# Patient Record
Sex: Female | Born: 1995 | Race: White | Hispanic: No | Marital: Single | Smoking: Never smoker
Health system: Southern US, Community
[De-identification: ages and names within clinical notes are randomized; demographics above are authoritative.]

## PROBLEM LIST (undated history)

## (undated) HISTORY — PX: ELBOW SURGERY: SHX618

---

## 2014-01-14 ENCOUNTER — Encounter: Payer: Self-pay | Admitting: Emergency Medicine

## 2014-01-14 ENCOUNTER — Emergency Department
Admission: EM | Admit: 2014-01-14 | Disposition: A | Discharge status: Home / Self Care | Payer: Self-pay | Source: Ambulatory Visit | Attending: Emergency Medicine | Admitting: Emergency Medicine

## 2014-01-14 ENCOUNTER — Telehealth: Payer: Self-pay | Admitting: Orthopedic Surgery

## 2014-01-14 MED ORDER — IBUPROFEN 200 MG PO TABS *I*
ORAL_TABLET | ORAL | Status: AC
Start: 2014-01-14 — End: 2014-01-14
  Administered 2014-01-14: 400 mg via ORAL
  Filled 2014-01-14: qty 2

## 2014-01-14 MED ORDER — IBUPROFEN 200 MG PO TABS *I*
400.0000 mg | ORAL_TABLET | Freq: Once | ORAL | Status: AC
Start: 2014-01-14 — End: 2014-01-14

## 2014-01-14 NOTE — ED Provider Notes (Signed)
History     Chief Complaint   Patient presents with    Fall     Pt states she slipped on balck ice this morning and fell backwards hitting her left elbow. + head strike. Denies LOC.     HPI Comments: Lydia Harrison is a 18 y.o. female who presents after a fall, this morning. She reports she slipped suddenly on ice this morning, landing forcibly on her left elbow, now c/o significant pain to left elbow, constant, associated with swelling.      History provided by:  Patient and parent      History reviewed. No pertinent past medical history.         History reviewed. No pertinent past surgical history.    History reviewed. No pertinent family history.      Social History      has no tobacco, alcohol, drug, and sexual activity history on file.    Living Situation    Questions Responses    Patient lives with     Homeless No    Caregiver for other family member     External Services     Employment     Domestic Violence Risk           Problem List     There is no problem list on file for this patient.      Review of Systems   Review of Systems   Constitutional: Negative for fever and fatigue.   HENT: Negative for sore throat.    Eyes: Negative for visual disturbance.   Respiratory: Negative for cough and shortness of breath.    Cardiovascular: Negative for chest pain.   Gastrointestinal: Negative for nausea, vomiting and abdominal pain.   Genitourinary: Negative for decreased urine volume.   Musculoskeletal: Positive for arthralgias. Negative for back pain, gait problem and neck pain.   Skin: Negative for rash.   Neurological: Negative for dizziness, syncope, weakness, light-headedness, numbness and headaches.   Psychiatric/Behavioral: Negative for confusion.       Physical Exam     ED Triage Vitals   BP Heart Rate Heart Rate(via Pulse Ox) Resp Temp Temp Source SpO2 O2 Device O2 Flow Rate   01/14/14 0800 01/14/14 0800 -- 01/14/14 0800 01/14/14 0800 01/14/14 0800 01/14/14 0800 01/14/14 0800 --   115/76 mmHg 75  16  36.4 C (97.5 F) TEMPORAL 100 % None (Room air)       Weight           01/14/14 0800           44.861 kg (98 lb 14.4 oz)               Physical Exam   Constitutional: She is oriented to person, place, and time. She appears well-developed and well-nourished. No distress.   Comfortable, calm.   HENT:   Head: Normocephalic and atraumatic.   Right Ear: External ear normal.   Left Ear: External ear normal.   Nose: Nose normal.   Eyes: Conjunctivae are normal. Right eye exhibits no discharge. Left eye exhibits no discharge. No scleral icterus.   Neck: Normal range of motion. Neck supple.   Cardiovascular: Normal rate, regular rhythm, normal heart sounds and intact distal pulses.    Pulmonary/Chest: Effort normal. No respiratory distress.   Abdominal: She exhibits no distension.   Musculoskeletal:   LUE exam:  Tenderness and swelling to olecranon. Full active ROM of wrist/fingers. 5/5 strength with wrist extension/flexion, finger abduction/flexion/grip. Able to  oppose thumb to little finger, give thumbs up and peace signs with strength. 2+ radial and ulnar pulses; cap refill brisk. Sensation intact over deltoid shoulder patch, 1st dorsal webspace, volar tip of index finger, ulnar aspect of little finger. Muscle compartments soft.     Neurological: She is alert and oriented to person, place, and time. She exhibits normal muscle tone.   Skin: Skin is warm and dry. No rash noted. She is not diaphoretic.   Psychiatric: She has a normal mood and affect. Her behavior is normal. Thought content normal.   Nursing note and vitals reviewed.      Medical Decision Making      Amount and/or Complexity of Data Reviewed  Tests in the radiology section of CPT: ordered and reviewed  Decide to obtain previous medical records or to obtain history from someone other than the patient: yes  Obtain history from someone other than the patient: yes  Review and summarize past medical records: yes  Discuss the patient with other providers:  yes  Independent visualization of images, tracings, or specimens: yes        Initial Evaluation:  ED First Provider Contact    Date/Time Event User Comments    01/14/14 0805 ED Provider First Contact Wymon Swaney Initial Face to Face Provider Contact          Patient seen by me as above    Assessment:  18 y.o., female comes to the ED with elbow pain after a fall. Pt is stable, well appearing, normal neurovascular exam in arm. Xrays reveal an intraarticular olecranon fracture. Orthopedic surgery team evaluated and splinted arm. Plan is to have patient return next week for outpatient surgery. Pt and mother understand and agree with plan.    Differential Diagnosis includes fall from standing, fall on ice, elbow fracture.              Plan:   Diagnostics: xray  Therapeutic: analgesia  Disposition: discharge home with outpatient ortho follow-up        Salem SenateNicholas Arpi Diebold, MD          Salem SenateAloisio, Hadyn Azer, MD  01/14/14 [phone]

## 2014-01-14 NOTE — ED Notes (Signed)
Patient here with mother, fell going to school, injury to left elbow. Dr. Lynelle SmokeAloisio saw patient on arrival to room 10.  Patient c/o pain in the left elbow.

## 2014-01-14 NOTE — Telephone Encounter (Signed)
Pt scheduled for Tuesday 3/17.  Mom aware of arrival time and restrictions.

## 2014-01-14 NOTE — Discharge Instructions (Signed)
You were seen today for an elbow fracture.     At home, get lots of rest, drink plenty of fluids, and eat a healthy diet. You may take ibuprofen or tylenol over-the-counter as needed for pain, use as directed.    If any of your symptoms worsen or you develop additional symptoms that are concerning to you, follow up with your primary care doctor or return to the Emergency Department.

## 2014-01-14 NOTE — ED Notes (Signed)
Patient has ice to left elbow.

## 2014-01-14 NOTE — Telephone Encounter (Signed)
Dr. Andee PolesHumphrey,     Here is the olecranon fracture we discussed earlier:     [name] ([id number]) - 18 yo F with left olecranon fracture.  Patient is a world class [place] [employer] (ranked #27 in the [place]).  Plan for ORIF L olecranon on Tuesday 01/19/14.     Raynelle FanningJulie, would you mind calling patient today for scheduling purposes?  [phone]     Thanks,     Maury DusMike     Michael Maceroli, M.D., Resident

## 2014-01-14 NOTE — Telephone Encounter (Signed)
Left message on VM for patient/parent's to call the office to have surgery set up for 3/17

## 2014-01-14 NOTE — Provider Consult (Signed)
Orthopaedic Surgery Consult Note  Lydia Harrison, MRN: [id number]    CC: L elbow pain    HPI: 18 y.o. 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  States that she landed directly on the left elbow.  Denies any other musculoskeletal complaints.  States that she did hit her head but currently denies any headache, vision, hearing or balance issues.  Reports pain in the left elbow, sharp in nature, worse with full extension, denies numbness or tingling distally.    Past Medical History:   History reviewed. No pertinent past medical history.    Past Surgical History:   History reviewed. No pertinent past surgical history.    Allergies:  No Known Allergies (drug, envir, food or latex)     Home Medications:     (Not in a hospital admission)    No current facility-administered medications for this encounter.     No current outpatient prescriptions on file.       Social History:  Occupation: Audiological scientist  Smoking: no  Etoh: no  Illicit Drugs: no         Family History:  History reviewed. No pertinent family history.    Review of systems: as per HPI, otherwise negative for 12 system review      Physical Examination:  Vitals  BP 116/76    Pulse 63    Temp(Src) 36.8 C (98.2 F) (Temporal)    Resp 18    Ht 1.575 m (_0 )    Wt 44.861 kg (98 lb 14.4 oz)    BMI 18.08 kg/m2      SpO2 100%    General  Normal appearance, well nourished  comfortable  Pleasant mood and affect  Resp: Breathing easy  CV: RRR  Abd: soft, nondistended    Left upper extremity  72m abrasion over L elbow, dermis intact  + tenderness to left olecranon, minimal ecchymosis, and moderate swelling  Intact thumbs up/O.K. sign/cross fingers  Intact sensation to light touch on the 1st dorsal webspace/tip of index and little fingers  Able to range elbow from 90 to 45 degrees of flexion  Nontender to palpation hand, wrist, forearm, shoulder  2+ radial  pulse    Right upper extremity and bilateral lower extremity:  Skin intact  Non-tender/full range of motion and strength throughout    No results found for this basename: WBC, HCT, CREAT, INR, PLT, K,  in the last 168 hours    No results found for this basename: WBC, ESR, CRP,  in the last 168 hours    Imaging: personally reviewed; 4 views L elbow demonstrate an intraarticular olecranon fracture with >251marticular stepoff    Assessment and Plan:  [age].o. female, with a left olecranon fracture    1. Long arm splint applied  2. Neurovascular exam unchanged after splint  3. Will require operative fixation of left olecranon, plan for OR with Dr. HuRaynelle Dickext Tuesday, office will call patient for scheduling  4. WB status: NWB LUE  5. Elevate/ice injured extremity  6. Keep splint clean and dry, do not remove splint  7. Pain control per ED  8. Call [phone] any questions or concerns    MiRosie FateMD, Orthopaedics Resident  01/14/2014  9:27 AM

## 2014-01-14 NOTE — ED Notes (Signed)
VSS.  Cleared for d/c. Verbalized understanding of instructions. Has sling and Scarano pillow for left arm.  Patient's CMS Within normal limits. Splint placed by ortho. Patient and mother know follow up is with Dr. Andee PolesHumphrey. Ambulated out.

## 2014-01-15 NOTE — Plan of Care (Signed)
Problem: Knowledge deficit related to pre or post-op regimens  Goal: Patient verbalizes understanding of PACU teaching  Outcome: Completed or Resolved Date Met:  01/15/14

## 2014-01-19 ENCOUNTER — Ambulatory Visit
Admit: 2014-01-19 | Disposition: A | Discharge status: Home / Self Care | Payer: Self-pay | Source: Ambulatory Visit | Attending: Orthopedic Surgery | Admitting: Orthopedic Surgery

## 2014-01-19 ENCOUNTER — Other Ambulatory Visit: Payer: Self-pay | Admitting: Orthopedic Surgery

## 2014-01-19 DIAGNOSIS — S52023A Displaced fracture of olecranon process without intraarticular extension of unspecified ulna, initial encounter for closed fracture: Secondary | ICD-10-CM

## 2014-01-19 LAB — POCT URINE PREGNANCY: Lot #: 104663

## 2014-01-19 MED ORDER — HALOPERIDOL LACTATE 5 MG/ML IJ SOLN *I*
0.5000 mg | Freq: Once | INTRAMUSCULAR | Status: DC | PRN
Start: 2014-01-19 — End: 2014-01-19

## 2014-01-19 MED ORDER — PROPOFOL 10 MG/ML IV EMUL (INTERMITTENT DOSING) WRAPPED *I*
INTRAVENOUS | Status: AC
Start: 2014-01-19 — End: 2014-01-19
  Filled 2014-01-19: qty 20

## 2014-01-19 MED ORDER — CEFAZOLIN SODIUM-2000 MG DEXTROSE 3% DUPLEX IV *I*
2000.0000 mg | INTRAVENOUS | Status: AC
Start: 2014-01-19 — End: 2014-01-19
  Administered 2014-01-19: 2000 mg via INTRAVENOUS
  Filled 2014-01-19: qty 1

## 2014-01-19 MED ORDER — ONDANSETRON HCL 2 MG/ML IV SOLN *I*
INTRAMUSCULAR | Status: AC
Start: 2014-01-19 — End: 2014-01-19
  Filled 2014-01-19: qty 2

## 2014-01-19 MED ORDER — PROMETHAZINE HCL 25 MG/ML IJ SOLN *I*
INTRAMUSCULAR | Status: AC
Start: 2014-01-19 — End: 2014-01-19
  Filled 2014-01-19: qty 1

## 2014-01-19 MED ORDER — LIDOCAINE HCL 2 % (PF) IJ SOLN *I*
INTRAMUSCULAR | Status: AC
Start: 2014-01-19 — End: 2014-01-19
  Filled 2014-01-19: qty 5

## 2014-01-19 MED ORDER — DEXAMETHASONE SODIUM PHOSPHATE 4 MG/ML IJ SOLN
INTRAMUSCULAR | Status: AC
Start: 2014-01-19 — End: 2014-01-19
  Filled 2014-01-19: qty 1

## 2014-01-19 MED ORDER — LACTATED RINGERS IV SOLN *I*
125.0000 mL/h | INTRAVENOUS | Status: DC
Start: 2014-01-19 — End: 2014-01-19
  Administered 2014-01-19: 125 mL/h via INTRAVENOUS

## 2014-01-19 MED ORDER — MIDAZOLAM HCL 1 MG/ML IJ SOLN *I* WRAPPED
INTRAMUSCULAR | Status: AC
Start: 2014-01-19 — End: 2014-01-19
  Filled 2014-01-19: qty 2

## 2014-01-19 MED ORDER — MORPHINE SULFATE 2 MG/ML IV SOLN *WRAPPED*
2.0000 mg | Status: DC | PRN
Start: 2014-01-19 — End: 2014-01-20

## 2014-01-19 MED ORDER — OXYCODONE HCL 5 MG PO TABS *I*
5.0000 mg | ORAL_TABLET | ORAL | Status: DC | PRN
Start: 2014-01-19 — End: 2014-01-28

## 2014-01-19 MED ORDER — LACTATED RINGERS IV SOLN *I*
20.0000 mL/h | INTRAVENOUS | Status: DC
Start: 2014-01-19 — End: 2014-01-19
  Administered 2014-01-19: 20 mL/h via INTRAVENOUS

## 2014-01-19 MED ORDER — FENTANYL CITRATE 50 MCG/ML IJ SOLN *WRAPPED*
INTRAMUSCULAR | Status: AC
Start: 2014-01-19 — End: 2014-01-19
  Filled 2014-01-19: qty 2

## 2014-01-19 MED ORDER — OXYCODONE HCL 5 MG PO TABS *I*
5.0000 mg | ORAL_TABLET | ORAL | Status: DC | PRN
Start: 2014-01-19 — End: 2014-01-20

## 2014-01-19 MED ORDER — LACTATED RINGERS IV SOLN *I*
75.0000 mL/h | INTRAVENOUS | Status: DC
Start: 2014-01-19 — End: 2014-01-20

## 2014-01-19 MED ORDER — SODIUM CHLORIDE 0.9 % IV SOLN WRAPPED *I*
20.0000 mL/h | Status: DC
Start: 2014-01-19 — End: 2014-01-19

## 2014-01-19 MED ORDER — PROMETHAZINE HCL 25 MG/ML IJ SOLN *I*
12.5000 mg | INTRAMUSCULAR | Status: DC | PRN
Start: 2014-01-19 — End: 2014-01-20

## 2014-01-19 MED ORDER — LIDOCAINE HCL 1 % IJ SOLN
0.1000 mL | INTRAMUSCULAR | Status: DC | PRN
Start: 2014-01-19 — End: 2014-01-19
  Administered 2014-01-19: 0.1 mL via SUBCUTANEOUS
  Filled 2014-01-19: qty 2

## 2014-01-19 NOTE — Discharge Instructions (Signed)
DISCHARGE INSTRUCTIONS  Elbow Surgery  Dr. Sharlene Mottsatherine Humphrey    Activity: Following surgery your elbow needs protection. Keep the splint or sling on for support.  To avoid swelling and stiffness, move your fingers at least 5 times every day.    Hygiene:  It is important to remove the sling daily to gently wash your arm, chest and armpit.  To reach the area, lean forward and let your arm dangle while you clean your armpit.  Be sure to dry the area thoroughly so your skin does not become irritated.  You should continue to use your deodorant.  If needed, keep a soft, dry washcloth here to keep the area clean and dry.    Wound/Dressing Care:  Put ice over your bandage for 30 minutes up to 5 times a day for 5 days after surgery. Keep the wound covered with a dry bandage until after your first visit in the office after surgery NEXT WEEK. Plan will be to remove your splint and begin HAND THERAPY IMMEDIATELY AFTER YOUR CLINIC VISIT.    Medications:  Resume your routine medications.  A prescription for pain medication will be provided.  This is a narcotic and these medications have side effects.  As the pain lessens, you may switch to a non-prescription strength medication (such as Tylenol or ibuprofen).  Many pain medications cause constipation.  You can help prevent this by drinking plenty of liquids, eating a well-balanced diet and using a stool softener if needed.    Follow-Up:  You should return for your next appointment NEXT WEEK.  If you have not been given an appointment yet, please call [phone] to arrange this.    Call the office if you experience any of the following:    1. Pain that increases in intensity after the first 24 hours  2. Increased swelling around the shoulder  3. Elevated temperature not associated with any other illness  4. Persistent numbness or tingling in your fingers  5. Increased pain, swelling, or foul/ pus like drainage at the incision site

## 2014-01-19 NOTE — H&P (Signed)
Patient's H&P reviewed. Injury discussed. Plan for ORIF reviewed in detail with Mom and patient.  They both agree to proceed.    UPDATES TO PATIENT'S CONDITION on the DAY OF SURGERY/PROCEDURE    I. Updates to Patient's Condition (to be completed by a provider privileged to complete a H&P, following reassessment of the patient by the provider):    Day of Surgery/Procedure Update:  History  History reviewed and no change    Physical  Physical exam updated and no change              II. Procedure Readiness   I have reviewed the patient's H&P and updated condition. By completing and signing this form, I attest that this patient is ready for surgery/procedure.      III. Attestation   I have reviewed the updated information regarding the patient's condition and it is appropriate to proceed with the planned surgery/procedure.    Sharlene MottsATHERINE Ernst Cumpston, MD as of 7:29 AM 01/19/2014

## 2014-01-19 NOTE — Procedures (Signed)
Procedure Report    Perioperative Brachial Plexus Nerve Block Procedure Note    Brachial plexus nerve block performed for post-operative pain control of the left elbow, requested by Sharlene Mottsatherine Humphrey, MD.    Start Time (needle in): [phone]  End Time (needle out): 0740    A Time Out verification procedure was performed prior to injection by verifying:   -  The correct patient (2 identifiers).   -  The correct nerve block procedure (as noted in Anesthesia PreOp Eval)    -  The correct side (as noted in Anesthesia PreOp Eval and by site marking)    Baseline Vitals:  Last Filed Vitals    01/19/14 0618   BP: 105/66   Pulse: 79   Temp: 36.6 C (97.9 F)   Resp: 16   SpO2: 100%       Premedication:  Please see scanned anesthesia record  Position: recumbant  Side: left  Site / Approch: Brachial Plexus / supraclavicular  Skin Prep: chlorhexidine + alcohol  Guidance: Ultrasound  Block Needle: 22 Ga. Short bevel 5 cm  Injection Findings:     Slow, easy injection: Yes   Spread of local visualized on ultrasound: Yes   Freqent negative aspiration: Yes   Paraesthesia: No  Local Anesthetic:   19 cc 0.5 % Bupivacaine    Epinephrine 1:200,00 Yes  Block Assessment: positive motor  Patient tolerated procedure well: Yes  Patients condition at end of procedure: stable    I personally performed the procedure.    Verdie ShireINTAE Nyeisha Goodall, MD

## 2014-01-19 NOTE — Op Note (Addendum)
PATIENTKYLEN, ISMAEL MR #:  [id number]   ACCOUNT #:  [id number] DOB:  08-25-1996    AGE:  18     SURGEON:  Leane Platt, MD  ASSISTANT:  Seward Meth, MD, FEL. and Leanora Ivanoff, MD, RES.  SURGERY DATE:  01/19/2014    PREOPERATIVE DIAGNOSIS:  Left olecranon fracture.    POSTOPERATIVE DIAGNOSIS:  Left olecranon fracture.    ANESTHESIA:  Brachial plexus nerve block, general anesthesia with an LMA.    OPERATIVE PROCEDURE:  Open reduction and internal fixation of left olecranon fracture.    IMPLANTS:  Synthes variable-angle locking olecranon plate with locking and cortical screws.    ESTIMATED BLOOD LOSS:  Less than 10 cc.    PACKING:  None.    DRAINS:  None.    FLUID TOTALS:  Intakes and outputs per anesthesia record.    SPECIMENS TO PATHOLOGY:  None.    PATIENT CONDITION:  Good upon transfer to the postanesthesia care unit.    INDICATION FOR PROCEDURE:  Ms. Piech is an 18 year old competitive [place] who sustained an injury to her left elbow.  Workup revealed a comminuted left olecranon fracture.  She was seen in Dr. Austin Miles clinic, where the risks, benefits, and alternatives to surgery were discussed with Mel Almond and her mother, and they elected to proceed with surgery.    DESCRIPTION OF PROCEDURE:  The patient was met in the preanesthesia holding area, where the consent was reviewed.  The H and P was updated and the left arm was marked by the attending physician as the correct operative site.  A brachial plexus nerve block was performed by the anesthesiologist, and the patient was brought into the operating suite and transferred supine to the operating table.  General anesthesia was induced and an LMA was placed.  She was then positioned in the right lateral decubitus position.  An axillary roll was placed and all bony prominences were well padded.  The left upper arm was supported by a stack of towels.  A well-padded tourniquet was placed on the upper arm and inflated for a total of 50  minutes for the procedure.  The left upper extremity was prepped and draped in the usual fashion.  [facility] surgical pause was undertaken, identifying patient, date of birth, laterality, proposed procedure, perioperative antibiotics, perioperative beta blockade, which was not indicated, and SCDs, which had been placed on bilateral lower extremities for DVT prophylaxis.     A standard midline curvilinear incision was made about the elbow.  The skin was sharply incised.  Careful dissection through the subcutaneous tissue was performed, maintaining careful hemostasis.  The triceps was split midline and the distal aspect of its insertion to the olecranon carefully reflected medially and laterally to permit space for the plate.  The fascia of the anconeus, extensor carpi ulnaris, and flexor carpi ulnaris was dissected subperiosteally off the olecranon.  The posterior cortex was intact and we were unable to locate the primary and secondary fracture ends.  At this point, a Synthes variable-angle olecranon plate was positioned over the olecranon and held with a K-wire.  Radiographs were obtained, which revealed satisfactory placement of the plate and satisfactory reduction of the fracture fragments.  The proximal-most locking screws were drilled, measured, and filled such that they had purchase in the subchondral bone.  The diaphyseal and metaphyseal screws were likewise drilled, measured, and filled.  Fluoroscopic views were taken to ensure maintenance of reduction and that the screws  did not penetrate the joint.  The wound was thoroughly irrigated with sterile saline.  The triceps tendon and fascia over the olecranon was reapproximated using interrupted Vicryl suture.  Subdermal chromic stitches were placed, and the skin was closed with Monocryl.  Dermabond was used to seal the incision and a sterile dry dressing consisting of Adaptic, fluffs, ABD pads, Webril and a posterior plaster splint with Ace overwrap  was performed.  The patient was placed in a shoulder immobilizer and awoken from anesthesia.  She was taken to the postanesthesia care unit in good condition.     Postoperatively, she will be discharged today.  She will be given oxycodone immediate release for pain control.  She will follow up next week with Lessie Dings, NP, for removal of the splint and initiation of hand therapy with 5 pound weight restriction immediately after that clinic visit.     Dr. Raynelle Dick was present for the entire procedure.       Dictated By:  Leanora Ivanoff, MD,RES    ATTENDING ATTESTATION     I was present for the entire case. I personally participated in all key and critical portions.     Jossiah Smoak A. Koleson Reifsteck MD  Associate Professor, Orthopaedic Trauma    ______________________________  Leane Platt, MD    ARC/MODL  DD:  01/19/2014 16:00:54  DT:  01/19/2014 16:39:45  Job #:  [id number]    cc:

## 2014-01-19 NOTE — INTERIM OP NOTE (Addendum)
Interim Op Note    Date of Surgery: 01/19/2014  Surgeon: Dr. Andee PolesHumphrey  First Assistants: L. Venetia MaxonStern, A. Neco Kling    Pre-Op Diagnosis: left olecranon fracture    Anesthesia Type: Other Block (brachial plexus), General with LMA    Post-Op Diagnosis:    Primary: same  Secondary:   Tertiary:     Additional Findings (Including unexpected complications): none    Procedure(s) Performed (including CPT 4 Code if available)  ORIF left olecranon fracture    Implants: Synthes variable angle locking olecranon plate with locking and cortical screws    Estimated Blood Loss: <10cc  Packing: No  Drains: No  Fluid Totals: Intakes & Outputs: per anesthesia records  Specimens to Pathology: no  Patient Condition: good      Postop: Okay for discharge today.  Placed into posterior plaster splint with shoulder immobilizer.  Oxycodone IR for pain control.  Follow-up next week with Berline LopesMegan Barbato, NP for removal of splint and initiation of hand therapy with 5lb weight-restriction.    Dictation: [id number]    Tyrone AppleAnna Mckenzye Cutright, MD 01/19/2014 9:32 AM  Orthopaedic Surgery, PGY-4, Pager [phone]

## 2014-01-19 NOTE — Anesthesia Pre-procedure Eval (Signed)
Anesthesia Pre-operative Evaluation for [place] History  No past medical history on file.  No past surgical history on file.  Social History  History   Substance Use Topics    Smoking status: Not on file    Smokeless tobacco: Not on file    Alcohol Use: Not on file      History   Drug Use Not on file     ______________________________________________________________________  Allergies: No Known Allergies (drug, envir, food or latex)  Prior to Admission Medications    Last Medication Reconciliation Action:  Up-To-Date Eligah East, RN 01/19/2014  6:29 AM              Last Dose Start Date End Date Provider     Multiple Vitamin (MULTIVITAMIN) TABS 01/17/2014 at Unknown time  --  --  [provider]     ibuprofen (ADVIL,MOTRIN) 600 MG tablet Past Week at Unknown time  --  --  [provider]        Current Facility-Administered Medications   Medication    Lactated Ringers Infusion    sodium chloride 0.9 % IV    lidocaine 1 % injection 0.1 mL    ceFAZolin 2 g/50 mL dextrose DUPLEX BAG     Admission Medications:  Scheduled Meds   cefazolin IV     IV Meds   lactated ringers 20 mL/hr (01/19/14 0639)    sodium chloride     PRN Meds   lidocaine 0.1 mL at 01/19/14 5573     Anesthesia Evaluation Information Source: per patient, per records, per family  GENERAL  Pertinent (-):  history of anesthetic complications (never had GA), FamHx of anesthetic complications, obesity        PULMONARY    + Recent URI (a few weeks prior, cough and runny nose, resolved at this time)            resolved  Pertinent (-):  asthma CARDIOVASCULAR  Denies cardiovascular issues    GI/HEPATIC/RENAL     Denies GI/hepatic/renal issues  Last PO Intake: >8hr before procedure    Pertinent (-):  GERD, liver  issues, renal issues NEURO/PSYCH    Denies neuro/psych issues  Pertinent (-):  seizures, cerebrovascular event    ENDO/OTHER    Denies endo issues  Pertinent (-):  diabetes mellitus    HEMATOLOGIC  Denies  hematologic issues  Pertinent (-):  bruises/bleeds easily, coagulopathy     Nursing Reported PO Status: Date Last PO Fluids: 01/18/14 1800  Date Last PO Solids: 01/18/14 1800  ______________________________________________________________________  Physical Exam    Airway            Mouth opening: normal            Mallampati: I            TM distance (fb): >3 FB            Neck ROM: full  Dental   Normal Exam   Cardiovascular           Rhythm: regular           Rate: normal  No murmur     Pulmonary     breath sounds clear to auscultation    No wheezes    Mental Status   Normal  evaluation         Most Recent Vitals: BP: 105/66 mmHg (01/19/14 0618)  Heart Rate: 79 (01/19/14 0618)  Temp: 36.6 C (  97.9 F) (01/19/14 0618)  Resp: 16 (01/19/14 0618)  Height: 157.5 cm (_0 ) (01/19/14 0618)  Weight: 44.453 kg (98 lb) (01/19/14 0618)  BMI (Calculated): 18 (01/19/14 0618)  BSA (Calculated - sq m): 1.39 sq meters (01/19/14 0618)  SpO2: 100 % (01/19/14 0618)    Vital Sign Ranges (last 24hrs)  Temp:  [36.6 C (97.9 F)] 36.6 C (97.9 F)  Heart Rate:  [79] 79  Resp:  [16] 16  BP: (105)/(66) 105/66 mmHg   O2 Device: None (Room air) (01/19/14 0629)    Most Recent Lab Results   Blood Type  No results found for this basename: aborh, abs   CBC  No results found for this basename: WBC, WBCM, HCT, PLT   Chem-7  No results found for this basename: NA, K, WBK, CL, CO2, UN, CREAT, GLU, PGLU   CrCl cannot be calculated (Patient has no serum creatinine result on file.).  Electrolytes  No results found for this basename: CA, MG, PO4   Coags  No results found for this basename: PTI, INR, PTT   LFTs  No results found for this basename: AST, ALT, ALK      Pregnancy Status: Postmenarcheal [5]  Patient's last menstrual period was 12/25/2013.    Lab Results   Component Value Date    PUPT Negative-Dilute urine specimens may cause false negative urine pregnancy results... 01/19/2014     ECG Results  No results found for this basename: rate, PR,  statement     ANES CPM    Radiology: Complete results  * Elbow Left Standard Ap, Lateral,  And Both Obliques    01/14/2014   Exam Site: [facility]  01/14/2014 8:35 AM ELBOW LT MIN 3 VIEWS   ORDERING CLINICAL INFORMATION:  ERECORD: pain ADDITIONAL CLINICAL INFORMATION:  None.   COMPARISON: None.   PROCEDURE: Left elbow pain   FINDINGS: There is comminuted fracture of the olecranon. No other  fracture identified. There is a hemarthrosis present. No dislocation.      01/14/2014   IMPRESSION:   Left olecranon fracture as above..   END OF REPORT    ________________________________________________________________________  Medical Problems  Patient Active Problem List    Diagnosis Date Noted    Closed fracture of olecranon process of ulna 01/19/2014       PreOp/PreProcedure Diagnosis (For more detail see procedural consent)            Left elbow fracture  Planned Procedure (For more detail see procedural consent)            Left elbow ORIF  Plan   ASA Score 1  Anesthetic Plan (general); Induction (routine IV); Airway (cuffed ETT); Line ( use current access); Monitoring (standard ASA); Positioning (supine and lateral decubitus); Pain (per surgical team and nerve block); PostOp (PACU)    Informed Consent     Risks:          Risks discussed were commensurate with the plan listed above with the following specific points:  aspiration, N/V, sore throat , damage to:(nerves, teeth, blood vessels, eyes), allergic Rx, unexpected serious injury    Anesthetic Consent:         Anesthetic plan and risks discussed with:  patient and mother    Plan discussed with:  surgeon      Attending Attestation: The patient or proxy understand and accept the risks and benefits of the anesthesia plan. By accepting this note, I attest that I have personally performed the history  and physical exam and prescribed the anesthetic plan within 48 hours prior to the anesthetic as documented by me above.    Author: Calla Kicks, MD

## 2014-01-19 NOTE — Progress Notes (Signed)
Pt stated "can I go home".  Pt denied any pain, tolerating ginger ale.  Left hand warm, pink, brisk cap refill.  Ice pack to left elbow.  Pt meets discharge criteria.  Pt discharged to home with parents.

## 2014-01-20 ENCOUNTER — Telehealth: Payer: Self-pay | Admitting: Orthopedic Surgery

## 2014-01-20 ENCOUNTER — Encounter: Payer: Self-pay | Admitting: Orthopedic Surgery

## 2014-01-20 NOTE — Telephone Encounter (Signed)
Letter written.  Waiting for ROI to send note to school.  Emailed email consent and ROI to pt's father so facilitate this.

## 2014-01-20 NOTE — Anesthesia Post-procedure Eval (Signed)
 Anesthesia Post-op Note    Patient: Lydia Harrison    Procedure(s) Performed: as per surgeon    Anesthesia type: General and Upper Extremity Peripheral Block    Patient location: PACU    Mental Status: Recovered to baseline    Patient able to participate in this evaluation: yes  Last Vitals: BP: 96/55 mmHg (01/19/14 1205)  BP MAP : 69 mmHg (01/19/14 1015)  Heart Rate: 56 (01/19/14 1205)  Temp: 36.9 C (98.4 F) (01/19/14 1015)  Resp: 16 (01/19/14 1205)  Height: 157.5 cm (5\' 2" ) (01/19/14 0618)  Weight: 44.453 kg (98 lb) (01/19/14 0618)  BMI (Calculated): 18 (01/19/14 0618)  BSA (Calculated - sq m): 1.39 sq meters (01/19/14 0618)  SpO2: 98 % (01/19/14 1205)      Post-op vital signs noted above are within patient's normal range  Post-op vitals signs: stable  Respiratory function: baseline    Airway patent: Yes    Cardiovascular and hydration status stable: Yes    Post-Op pain: Adequate analgesia    Post-Op nausea and vomiting: none    Post-Op assessment: no apparent anesthetic complications, tolerated procedure well and no evidence of recall    Complications: none    Attending Attestation: All indicated post anesthesia care provided    Author: Verdie Shire, MD  as of: 01/20/2014  at: 7:29 PM       Pt called at home for follow up. Spoke with mom and nerve block has completely worn off by this morning around 6am. Denies residual numbness or weakness. Denies any apparent anesthesia complications.     Verdie Shire, MD  7:31 PM  01/20/2014

## 2014-01-20 NOTE — Telephone Encounter (Signed)
Allyanna's mom, Nicholos JohnsKathleen, called asking for a note for gym class.  It needs to state specifics.  Please advise.

## 2014-01-21 NOTE — Telephone Encounter (Signed)
Pt's mom called asking if I received the ROI and email consent via fax.  I did not and gave her the fax number again.

## 2014-01-21 NOTE — Telephone Encounter (Signed)
ROI and Email Consent received via fax.  Letter for gym faxed to school.

## 2014-01-26 ENCOUNTER — Other Ambulatory Visit: Payer: Self-pay | Admitting: Surgery

## 2014-01-26 DIAGNOSIS — Z09 Encounter for follow-up examination after completed treatment for conditions other than malignant neoplasm: Secondary | ICD-10-CM

## 2014-01-28 ENCOUNTER — Ambulatory Visit: Payer: Self-pay | Admitting: Surgery

## 2014-01-28 ENCOUNTER — Ambulatory Visit: Payer: Self-pay | Admitting: Rehabilitative and Restorative Service Providers"

## 2014-01-28 ENCOUNTER — Encounter: Payer: Self-pay | Admitting: Surgery

## 2014-01-28 VITALS — BP 105/71 | Ht 62.0 in | Wt 99.0 lb

## 2014-01-28 DIAGNOSIS — S52023A Displaced fracture of olecranon process without intraarticular extension of unspecified ulna, initial encounter for closed fracture: Secondary | ICD-10-CM

## 2014-01-28 NOTE — Progress Notes (Signed)
S/P Open reduction and internal fixation of left olecranon fracture 01/19/14. She has minimal pain.  She is no longer taking pain medication.  She has abided by restrictions. Denies fevers or chills. Denies numbness or tingling in the left upper extremity. She is eager to compete in the [place] dancing competition in one week.  No other concerns today.    Exam: Incision C/D/I with Dermabond. No erythema or drainage. Resolving ecchymosis and swelling about the elbow.    Xrays: demonstrate acceptable alignment of fracture. All hardware Remains in position.    Plan: she is provided with a elbow sleeve she can wear during the day and work on gentle elbow range of motion. sHe was provided with a Orthoplast brace she can wear at night.  Five-pound lifting restriction left upper extremity. she Is prescribed formal physical therapy for gentle range of motion. She understands the risks of competing in [place] dance prior to her fracture being healed.  She also understands she most likely will not be able to get her elbow straight by competition. She should not participate in gym or sports. FU in four weeks.  I am ordering AP, lateral of the left elbow. Patient and her mother agrees with plan.  Will FU sooner with any new concerns or complaints.    This document was dictated with Conservation officer, historic buildingsDragon voice recognition software. Please excuse any errors as the document was typeread to the best of my ability.

## 2014-01-28 NOTE — Progress Notes (Signed)
Upper Extremity and Hand Rehabilitation  Hand Splint Evaluation    Lydia Harrison is a 18 y.o. female who is here to day for   Encounter Diagnosis   Name Primary?    s/p ORIF L olecranon 01/19/14 Yes       Affected Arm:  Right, elbow    Treatment:  Splint fabrication and instruction in splint use.    Splint type:  Posterior long arm splint    Splint wearing schedule:  All times except hygiene/skin    Patient /Family Education:  Skin care    Communication:  Instructed patient    Short Term Goals:  Splinting  After instruction, the patient will demonstrate the (proper donning/doffing of splint, splint care, precautions, understanding of wearing schedule) to insure correct follow through with home care program.     Plan:  Initate hand therapy at [facility]    Meredith ModyLaura Mumtaz Lovins MS,PT,CHT

## 2014-02-03 ENCOUNTER — Encounter: Payer: Self-pay | Admitting: Surgery

## 2014-02-04 ENCOUNTER — Encounter: Payer: Self-pay | Admitting: Gastroenterology

## 2014-03-04 ENCOUNTER — Ambulatory Visit: Payer: Self-pay | Admitting: Orthopedic Surgery

## 2014-03-04 ENCOUNTER — Encounter: Payer: Self-pay | Admitting: Orthopedic Surgery

## 2014-03-04 VITALS — BP 98/66 | Ht 62.0 in | Wt 98.0 lb

## 2014-03-04 DIAGNOSIS — S52023A Displaced fracture of olecranon process without intraarticular extension of unspecified ulna, initial encounter for closed fracture: Secondary | ICD-10-CM

## 2014-03-04 NOTE — Progress Notes (Signed)
FOllow up Visit:   Lydia Harrison     HISTORY  18 y.o. female  The patient presents today with her mother for f/u of a left olecranon fracture s/p ORIF on 01/19/2014. She has been doing well, and is abiding by restrictions. She has been working with physical therapy. She denies any pain in the left elbow. She denies fevers, chills, or numbness/tingling in the LUE. Her and her mother have no concerns today.    PAST MEDICAL, Social, Family HISTORY & Review of systems  Histories are reviewed and are documented in the record which I have reviewed and agree with.      PHYSICAL EXAM  Vital Signs: BP 98/66    Ht 1.575 m (5\' 2" )    Wt 44.453 kg (98 lb)    BMI 17.92 kg/m2      General: Well-appearing, well-nourished female. Alert and oriented in NAD.  LUE: Incision is C/D/I without any surrounding erythema, warmth, or drainage. The fracture site is non-tender to palpation.  Elbow ROM from 10-100 degrees. Full pronation and supination. Demonstrates active EPL, cross finger adduction, and thumb opposition.  Sensation intact to light touch in all 5 digits. Brisk cap refill. 2+ radial pulse.    IMAGING:  Left elbow x-rays: demonstrate no change in the alignment of her hardware. The fracture is healing well.      Impression/PLAN  The patient is an 18 y/o female s/p orif of a left olecranon fracture approximately 6 weeks out. She should continue to work with pt on regaining elbow rom. She may advance to wbat lue. She should follow up in 8 weeks for repeat x-ray and physical exam.    Orthopedic attending addendum    Ms. Montez MoritaCarter demonstrates a good recovery from her elbow injury.  Her range of motion is quite limited however.  We discussed the importance of regaining his range of motion in the early phases of her recovery.  Her x-rays demonstrate good alignment and early healing.  The patient is active and motivated and us I feel the ongoing therapy should be an appropriate treatment to restore range of motion.  She and her mother  express an understanding of the importance of working consistently on motion.  She should return to see me in 2 months with new x-rays of the right elbow at that time.    Verenise Moulin A. Jacquline Terrill  Associate Professor of Orthopaedics      This document was dictated with Conservation officer, historic buildingsDragon voice recognition software.  Please excuse any errors as the document was typeread to the best of my ability.

## 2014-03-09 ENCOUNTER — Encounter: Payer: Self-pay | Admitting: Gastroenterology

## 2014-03-31 ENCOUNTER — Encounter: Payer: Self-pay | Admitting: Gastroenterology

## 2014-04-21 ENCOUNTER — Ambulatory Visit: Payer: Self-pay | Admitting: Adolescent Medicine

## 2014-04-21 ENCOUNTER — Encounter: Payer: Self-pay | Admitting: Adolescent Medicine

## 2014-04-21 VITALS — BP 125/84 | HR 82 | Ht 62.01 in | Wt 97.0 lb

## 2014-04-21 DIAGNOSIS — F5000 Anorexia nervosa, unspecified: Secondary | ICD-10-CM

## 2014-04-21 DIAGNOSIS — R634 Abnormal weight loss: Secondary | ICD-10-CM

## 2014-04-21 NOTE — Progress Notes (Signed)
HPI: Lydia Harrison presents on her own. Mom in waiting room.   This year-started to care about her weight-had a lot of stress-mostly related to college-maintaining grades.  Eating-could control. Eating started to change in Feb-no goal weight, just decreased portion size.   In mid Feb,  Mom started to  notice-told her that she needed to eat more. During her restrictive period and weight loss, she continued to dance [place]), but her performance was greatly affected-just did not feel the same. She tried to eat more, but in March, she fell on black ice and had a fracture in her arm, so because she was not as active, restricted again. Once her cast was off (April), she  returned to dance full time, she has tried to increase her intake, but she has not been able to re-gain. She has interrupted loss. She knows that "not eating" is "not helping;" she feels that logic is helpful for her.   Allergies: Review of patient's allergies indicates no known allergies (drug, envir, food or latex).    Medications:   Current Outpatient Prescriptions on File Prior to Visit   Medication Sig Dispense Refill    Multiple Vitamins-Iron (MULTIVITAMIN/IRON PO) Take by mouth daily         No current facility-administered medications on file prior to visit.       Past Medical History  Maximum weight:107/8   [facility]  Not weighing self  Minimum weight: 96 Date:2 months ago  Illnesses : denies  Hospitalizations : no  Surgery :no  Menarche : yes  LMP : 5/22, PMP: 4/21,   cycle regular, flow moderate; starts heavy last 4-5 days; some cramps-    Family History  Family history of eating disorders ? no  No family history on file.   No mood disorders  Dietary History  Estimated Intake : portions look small  Woke: 7-internal clock-well rested   7 hunger (wanting food) -AV:[id number]:bagel, jam, banana (felt satisfied)  Dance x 1 hour  Snack: g bar and h boiled egg (satisfied) not hard to eat  L: 12: pancakes (3) and oatmeal w walnut soy milk and honey   Snack:  pretzels  Water-during the day-8 cups-fills water bottle during the day  D: 5:chicken sandwich with cheese, tomato on whole wheat bread, broccoli and rice; water  Awards ceremony: fruit and yogurt  730-930-dance  pancake w PB    Counting calories?not really-  Fat-goal-not to have "a lot"  Binge Eating : no  Purging : yes-3 times-change her mindset-just stopped  Laxatives/Diuretics : no  Diet Pills : no      Exercise History :   Bike rides-depends on the day-if feels like going -may be 1 hour-or 20 min-  Mood or music  Dance 2 days Tues-2 hours wed 1 hour  Day 2 hours      Personal/Social History   Home/Friends : home with mom dad and sister; sister is almost 6720; gets along w mom; more like her Dad-more serious-math minded Dad works a lot-; mom-critical-about everything-does not like to make her mom mad; thinks that her parents treat her like a baby  Education/Work : finished senior year-grad next [employer]-choosing to go 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 yes  Sexually active? no  Prior STIs: none  Screened for Chlamydia in the past 12 months? no  Ever tested positive for Chlamydia? no  New partner since your last STI screening? no  STI screening offered? no  Screened for Gonorrhea and Chlamydia? no- not active    ROS : general -  none  eye symptoms -  none  otolaryngeal symptoms -  none  neck symptoms -  none  cardiovascular symptoms -  none  pulmonary symptoms -  none  gastrointestinal symptoms-  none  genitourinary symptoms -  none  endocrine  symptoms -  none  skin symptoms -  none  hematological symptoms -  none  musculoskeletal symptoms -  none  neurological symptoms-  none  psychological symptoms: denies depression, anxiety, OC traits, sleep issues,       Physical Exam -   Wt Readings from Last 3 Encounters:   04/21/14 44 kg (97 lb) (2 %*, Z = -1.99)   03/04/14 44.453 kg (98 lb) (3 %*, Z = -1.88)   01/28/14 44.906 kg (99 lb) (4 %*, Z = -1.77)     * Growth percentiles are based on CDC 2-20 Years data.     Vitals :  Filed Vitals:    04/21/14 1028   BP: 125/84   Pulse: 82   Height: 1.575 m (5' 2.01")   Weight: 44 kg (97 lb)     BMI: Body mass index is 17.74 kg/(m^2).  General Appearance : underweight  Skin : dry  Eyes : normal  Head : normal  ENT : no enamel erosion  Neck : normal, supple full range of motion and no thyroid enlargement  Lungs : clear to auscultation, no wheezes, rales or rhonchi, symmetric air entry  Cardiovascular : regular rate and rhythm, S1, S2 normal, no murmur, click, rub or gallop, Heart rate - lying 68, Heart rate - sitting 72 and Heart rate - standing 86  Abdomen : normal  GU : exam deferred  Musculoskeletal : normal  Neuro : Gait normal. Reflexes normal and symmetric. Sensation grossly normal and normal mental status  Psychological :Mood  anxious and Affect  congruent with mood    Assessment - 18 yo F with restrictive anorexia nervosa who lost about 10 pounds over 3-4 months; she has been able to interrupt weight loss, but has not been able to weight restore    Physiological - stable; her weight is 88% ABW; ABW=110 pounds; her lifetime high weight was 108 pounds; her vitals are normal, she is slight;y orthostatic, but not symptomatic; she is having normal menses  Insight/Motivation - preparation    Counseling/Education -  Discussed with patient - diagnosis, prognosis, risk reduction, medication side effects and compliance   -Explained and provided guide to Good Eating, mal plans  -importance of meal planning and  structure  -goal is to weight restore to previous high weight  -multidisciplinary approach: medical, RD, and therapist  -importance of including family and friends, but if she wants to try this alone for now, that is her choice  -her level of exercise will make weight restoration challenging  Discussed with patient and parent - Lydia Harrison did not want to include her parents for now  Time spent with patient - 120 minutes > 50% time spent counseling.    Recommendations -  Nutrition:start with 2500, 3000, then 3500   Exercise:no restrictions for now, but contingent upon completion of nutrition   Counseling:provided names   Medications:  no change    Labs : none                                                                                         Appropriate goal weight:previous weight: 108 pounds; ABW=110; typical healthy range would be +/-7% (99-121) with normal vitals, menses and mentation  Parent Group Referral:not for now    Follow up:3  weeks    Loleta Rose, DO

## 2014-05-09 ENCOUNTER — Other Ambulatory Visit: Payer: Self-pay | Admitting: Surgery

## 2014-05-09 DIAGNOSIS — Z09 Encounter for follow-up examination after completed treatment for conditions other than malignant neoplasm: Secondary | ICD-10-CM

## 2014-05-13 ENCOUNTER — Encounter: Payer: Self-pay | Admitting: Orthopedic Surgery

## 2014-05-13 ENCOUNTER — Ambulatory Visit: Payer: Self-pay

## 2014-05-13 ENCOUNTER — Ambulatory Visit: Payer: Self-pay | Admitting: Orthopedic Surgery

## 2014-05-13 VITALS — BP 98/62 | Ht 62.0 in | Wt 100.0 lb

## 2014-05-13 DIAGNOSIS — S52022D Displaced fracture of olecranon process without intraarticular extension of left ulna, subsequent encounter for closed fracture with routine healing: Secondary | ICD-10-CM

## 2014-05-13 NOTE — Progress Notes (Signed)
Reason for visit: Nutrition evaluation    HPI: Began to change eating habits early this year as increased stress led her to focus on her weight. Restriction led to a 10# weight loss that affected her dancing. She was not successful in her attempts to eat more - especially after breaking her arm and having a resultant exercise restriction. With the cast of, she was able to stop weight loss but not regain the weight.    Mom comes to the clinic with her but just to "back her up if she needs it". Wants to "start doing things on her own" so attends visits alone, with mom in the waiting room.    Allergies: Review of patient's allergies indicates no known allergies (drug, envir, food or latex).    Medications:   Current Outpatient Prescriptions on File Prior to Visit    Medication  Sig  Dispense  Refill      Multiple Vitamins-Iron (MULTIVITAMIN/IRON PO)  Take by mouth daily      No current facility-administered medications on file prior to visit.        Weight History:  Maximum weight:107 Date: Jan 2015  Minimum weight: 96 Date: May 2015    Allergies:Review of patient's allergies indicates no known allergies (drug, envir, food or latex).  Medications:   Current Outpatient Prescriptions on File Prior to Visit   Medication Sig Dispense Refill    Multiple Vitamins-Iron (MULTIVITAMIN/IRON PO) Take by mouth daily         No current facility-administered medications on file prior to visit.     Problems:  Patient Active Problem List   Diagnosis Code    s/p ORIF L olecranon 01/19/14 813.01       Dietary Hx: following plan provided    Eating disorder behaviors:  --Restrictive eating - denies  --Binge eating - denies  --Purging - denies  --Fear/Trigger Foods - none  --Laxatives/Diuretics: 0  --Diet Pills: 0    Diet:  --Food allergies: none  --Food aversions: seafood  --Vitamins/supplements/herbs: yes - mvi  --Fruit and vegetable consumption= 5/d good  --Breakfast consumption: every day  --Who cooks/shops: mom  --Family dinners/wk:  "all girls" dinner during week; everyone on weekend (dad works late week nights)  --Consumption of fast food, take out restaurants or restaurant meals: take out once/week; one celebration  --Snacks frequently between meals: yes    Dietary recall:    Wednesday:  --Up - 8:30  --Breakfast- 8:30 - 2 bowls cereal (cheerios and coco puffs) with 1% milk; coffee (with milk)  --AM Snack - 10 - pretzels and pb  --Lunch - 11:30 - sandwich (Malaw[place]key cheese and pickls) grilled; pretzels; blueberries; banana; water  --PM Snack -2:30 - Lucky Charms (dry)  --Dinner - 5:30 - 3 slices pizza (leftover - storebought - white pizza with cheese and basil); salad (vegetables, croutons, caesar dressing); ice cream (2 scoops cookie dough) in dish  --Evening Snack - none  --Bed - 9 - 9:30    Fluids:  Water - 4-5 cups/day - more if hot out  Milk - occasional   Juice - occasionally  Soda - no  Coffee - 1-2  Sugar free drinks - no  Alcohol - no    Exercise Hx:  2-3 hours/day every day; 6 days/week  Summer activities    Personal Hx:  Lives with : parents; sister in college (gets along when home)  School/grade: freshman in college - [employer]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  Weight status category: within range  Percentile/age:1%ile (Z=-0.97) based on CDC 2-20 Years BMI-for-age data using vitals from 05/13/2014.    Esme seems to have done a great job following through on her diet plan that Dr Lawerance Cruel provided. She was able to gain weight despite last weeks Nationals competition! She was #6 out of 66 and will be participating in the  International competition next year in [place]! She reports she has taken this week off from dance as a treat to herself.    Iyari's intake is consistent and well balanced. Calcium seems to be the only nutrient that is short. She was willing to fix this by adding more dairy or a supplement to her diet. She seems quite confident with her ability to choose and consume the appropriate amount of food and came today with no questions or concerns.    Commended her for her hard work.     Intervention:  --Nutrition education: discussed basic nutrition and meal plan  --Meal planning:Food Diary and Specific nutrient education  Behavior Modifications    Plan:    Recommendations:  --kcals required:3000  --Kcals consumed: 3000  --Nutrition:   Continue to follow current meal plan.  Pay attention to calcium intake - add more  or supplement 500 mg/day  --Exercise: ok  --Supplements: may add calcium  --Appropriate goal weight:previous weight: 108 pounds; ABW=110; typical healthy range would be +/-7% (99-121) with normal vitals, menses and mentation    --Follow up: Next week with Dr Lawerance Cruel  --Time spent: 40 minutes

## 2014-05-13 NOTE — Progress Notes (Signed)
FOllow up Visit      HISTORY    Lydia Harrison is [age] y.o. female 4 months s/p ORIF of left olecranon fracture on 01/19/14. She is experiencing no pain and has completed PT for ROM exercises for residual stiffness seen at last visit. She recently [place] a [place] and received 6th place in the [place]. Trav[place] to [place] world [place] in [place] next week. No other complaints. No numbness/tingling/ weakness in fingers, wrist or elbow.    PAST MEDICAL, Social, Family HISTORY & Review of systems  Histories are reviewed and are documented in the record which I have reviewed and agree with.      PHYSICAL EXAM  Vital Signs: BP 98/62    Ht 1.575 m (5\' 2" )    Wt 45.36 kg (100 lb)    BMI 18.29 kg/m2        Gen: Well-developed female in NAD  LUE:  - incision well healed over olecrannon  - ROM at elbow 5* - 130*, increased from last visit  - SILT over 1st DWS/Tip of index finger/Ulnar aspect of small finger  - Active thumb extension, OK sign, finger cross and abduction  - 2+ Radial pulse, fingers warm and well perfused      IMAGING:  Radiographs obtained today were compared with prior films performed on 4/30. No interval change in implant or alignment of fracture. Fracture healed      Impression/PLAN  Lydia NeuBailey Segundo  Is [age] y.o. female 4 months s/p ORIF of left olecranon fracture on 01/19/14. Fracture healed. No pain.     No activity restrictions. WBAT LUE   If the implant does become painful this can be removed. She would have activity restrictions for roughly 6 weeks if this were the case. This was discussed with the patient. Questions invited and answered.   Follow PRN    This patient was seen in consultation with Dr. Andee PolesHumphrey. She is in agreement with the above assessment and plan.    Jory SimsAlexander Brown, MD, PGY-2  Orthopaedic Resident  05/13/2014 at 9:26 AM      Orthopaedic Trauma Attending    I personally saw and examined the patient.  I have reviewed the resident's note and agree with their  finding and plan as documented above.  I discussed the xrays and exam findings directly with the patient and communicated our plan going forward.      Sharlene MottsATHERINE Belvin Gauss, MD

## 2014-05-17 ENCOUNTER — Ambulatory Visit: Payer: Self-pay | Admitting: Adolescent Medicine

## 2014-05-19 ENCOUNTER — Encounter: Payer: Self-pay | Admitting: Orthopedic Surgery

## 2018-02-23 ENCOUNTER — Emergency Department (HOSPITAL_COMMUNITY): Payer: BLUE CROSS/BLUE SHIELD

## 2018-02-23 ENCOUNTER — Other Ambulatory Visit: Payer: Self-pay

## 2018-02-23 ENCOUNTER — Encounter (HOSPITAL_COMMUNITY): Payer: Self-pay

## 2018-02-23 ENCOUNTER — Emergency Department (HOSPITAL_COMMUNITY)
Admission: EM | Admit: 2018-02-23 | Discharge: 2018-02-23 | Disposition: A | Discharge status: Home / Self Care | Payer: BLUE CROSS/BLUE SHIELD | Attending: Emergency Medicine | Admitting: Emergency Medicine

## 2018-02-23 DIAGNOSIS — Y929 Unspecified place or not applicable: Secondary | ICD-10-CM | POA: Diagnosis not present

## 2018-02-23 DIAGNOSIS — S93402A Sprain of unspecified ligament of left ankle, initial encounter: Secondary | ICD-10-CM | POA: Insufficient documentation

## 2018-02-23 DIAGNOSIS — Y999 Unspecified external cause status: Secondary | ICD-10-CM | POA: Insufficient documentation

## 2018-02-23 DIAGNOSIS — W010XXA Fall on same level from slipping, tripping and stumbling without subsequent striking against object, initial encounter: Secondary | ICD-10-CM | POA: Diagnosis not present

## 2018-02-23 DIAGNOSIS — Y9341 Activity, dancing: Secondary | ICD-10-CM | POA: Diagnosis not present

## 2018-02-23 DIAGNOSIS — S99812A Other specified injuries of left ankle, initial encounter: Secondary | ICD-10-CM | POA: Diagnosis present

## 2018-02-23 MED ORDER — HYDROCODONE-ACETAMINOPHEN 5-325 MG PO TABS: 1.0000 | ORAL_TABLET | Freq: Three times a day (TID) | ORAL | 0 refills | Status: AC | PRN

## 2018-02-23 NOTE — ED Triage Notes (Signed)
[place] was [place] and fell on a jump. Pt fell on her backside and left ankle. No LOC and did not hit head. 50 mcg fentanyl en route.

## 2018-02-23 NOTE — Discharge Instructions (Signed)
You may alternate taking Tylenol and Ibuprofen as needed for pain control. You may take 400-600 mg of ibuprofen every 6 hours and [phone] mg of Tylenol every 6 hours. Do not exceed 4000 mg of Tylenol daily as this can lead to liver damage. Also, make sure to take Ibuprofen with meals as it can cause an upset stomach. Do not take other NSAIDs while taking Ibuprofen such as (Aleve, Naprosyn, Aspirin, Celebrex, etc) and do not take more than the prescribed dose as this can lead to ulcers and bleeding in your GI tract. You may use warm and cold compresses to help with your symptoms.   Please wear the ankle splint that was provided for you today and use crutches until you are seen by orthopedic doctor at [place] in [place].  Please do not bear any weight on the ankle and to you are seen by the orthopedic doctor.  Please follow up with your primary doctor within the next 7-10 days for re-evaluation and further treatment of your symptoms.   Please return to the ER sooner if you have any new or worsening symptoms.

## 2018-02-23 NOTE — ED Provider Notes (Signed)
[facility]-EMERGENCY DEPT Provider Note   CSN: [id number] Arrival date & time: 02/23/18  1045     History   Chief Complaint Chief Complaint  Patient presents with  . Ankle Pain    HPI Jill Maldonado is a 22 y.o. female.  HPI   Patient is a 22 year old female who presents the ED today complaining of left ankle pain that occurred she fell about 2 hours prior to arrival.  Patient was in a dance competition when she jumped up and landed on her left ankle twisted inward.  She then fell onto her bottom.  Did not her head or lose consciousness.  Denies any other injuries from the fall.  After the fall she had sudden onset severe pain in the left ankle that is constant in nature.  Describes pain initially as 10/10 and it improved after receiving fentanyl in route per EMS.  Denies any numbness or weakness to the foot.  Reports associated swelling to the left side of the ankle.   History reviewed. No pertinent past medical history.  There are no active problems to display for this patient.   Past Surgical History:  Procedure Laterality Date  . ELBOW SURGERY Left      OB History   None      Home Medications    Prior to Admission medications   Not on File    Family History No family history on file.  Social History Social History   Tobacco Use  . Smoking status: Never Smoker  . Smokeless tobacco: Never Used  Substance Use Topics  . Alcohol use: Never    Frequency: Never  . Drug use: Never     Allergies   Patient has no known allergies.   Review of Systems Review of Systems  Musculoskeletal:       Left ankle pain  Neurological: Negative for weakness and numbness.       No head trauma or LOC     Physical Exam Updated Vital Signs BP 117/76 (BP Location: Right Arm)   Pulse 79   Temp 97.9 F (36.6 C) (Oral)   Resp 17   Ht 5\' 2"  (1.575 m)   Wt 45.4 kg (100 lb)   LMP 02/23/2018   SpO2 95%   BMI 18.29 kg/m   Physical Exam    Constitutional: She appears well-developed and well-nourished. No distress.  HENT:  Head: Normocephalic and atraumatic.  Eyes: Conjunctivae are normal.  Neck: Neck supple.  Cardiovascular: Normal rate.  Pulmonary/Chest: Effort normal.  Musculoskeletal:  Tenderness and soft tissue swelling to the left lateral malleolus.  No tenderness to the medial malleolus.  Mild tenderness to the distal fibula.  No tenderness to proximal fibula or tibia.  No obvious deformity.  Able to wiggle toes bilaterally.  Able to dorsiflex and plantarflex feet bilaterally however somewhat decreased on the left due to swelling and pain.  DP pulses intact bilaterally.  Sensation intact bilaterally. brisk Cap refill to all toes bilaterally.  Neurological: She is alert.  Skin: Skin is warm and dry.  Psychiatric: She has a normal mood and affect.  Nursing note and vitals reviewed.    ED Treatments / Results  Labs (all labs ordered are listed, but only abnormal results are displayed) Labs Reviewed - No data to display  EKG None  Radiology Dg Ankle Complete Left  Result Date: 02/23/2018 CLINICAL DATA:  Twisting injury left ankle while dancing today. Initial encounter. EXAM: LEFT ANKLE COMPLETE - 3+ VIEW COMPARISON:  None. FINDINGS: Lateral soft tissue swelling is identified. No bony or joint abnormality is seen. No tibiotalar joint effusion. IMPRESSION: Lateral soft tissue swelling without underlying bony or joint abnormality. Electronically Signed   By: Drusilla Kannerhomas  Dalessio M.D.   On: 02/23/2018 11:32    Procedures Procedures (including critical care time) SPLINT APPLICATION Date/Time: 12:36 PM Authorized by: Karrie Meresortni S Lazarius Rivkin Consent: Verbal consent obtained. Risks and benefits: risks, benefits and alternatives were discussed Consent given by: patient Splint applied by: orthopedic technician Location details: LLE Splint type: ASO ankle splint Supplies used: ASO ankle splint Post-procedure: The splinted body  part was neurovascularly unchanged following the procedure. Patient tolerance: Patient tolerated the procedure well with no immediate complications.  Medications Ordered in ED Medications - No data to display   Initial Impression / Assessment and Plan / ED Course  I have reviewed the triage vital signs and the nursing notes.  Pertinent labs & imaging results that were available during my care of the patient were reviewed by me and considered in my medical decision making (see chart for details).      Final Clinical Impressions(s) / ED Diagnoses   Final diagnoses:  Sprain of left ankle, unspecified ligament, initial encounter   Patient presenting with left ankle pain after fall prior to arrival.  Vital signs are stable.  No other injuries from the fall.  She is neurovascularly intact with tenderness and swelling to the left lateral malleolus.  Dorsiflexion plantarflexion intact.  X-ray negative for acute fracture or dislocation.  Mortise did not appear to be widened.  Gave a so splint and crutches.  Patient is from out of town and was advised to follow-up with her regular orthopedic doctor when she arrives back home tomorrow.  Rice protocol discussed as well as Tylenol and ibuprofen over-the-counter for pain.  Patient and family at bedside understand the plan for follow-up and reasons to return immediately to the emergency department.  All questions answered.  ED Discharge Orders    None       Rayne DuCouture, Tanessa Tidd S, PA-C 02/23/18 1236    Donnetta Hutchingook, Brian, MD 02/26/18 [phone]

## 2018-02-23 NOTE — ED Notes (Signed)
Bed: WTR5 Expected date:  Expected time:  Means of arrival:  Comments: 

## 2019-02-23 IMAGING — CR DG ANKLE COMPLETE 3+V*L*
3 series · 3 of 3 positions shown · non-contrast
Comparison: None.

CLINICAL DATA: Twisting injury left ankle while dancing today.
Initial encounter.

EXAM:
LEFT ANKLE COMPLETE - 3+ VIEW

[x ankle ap left]
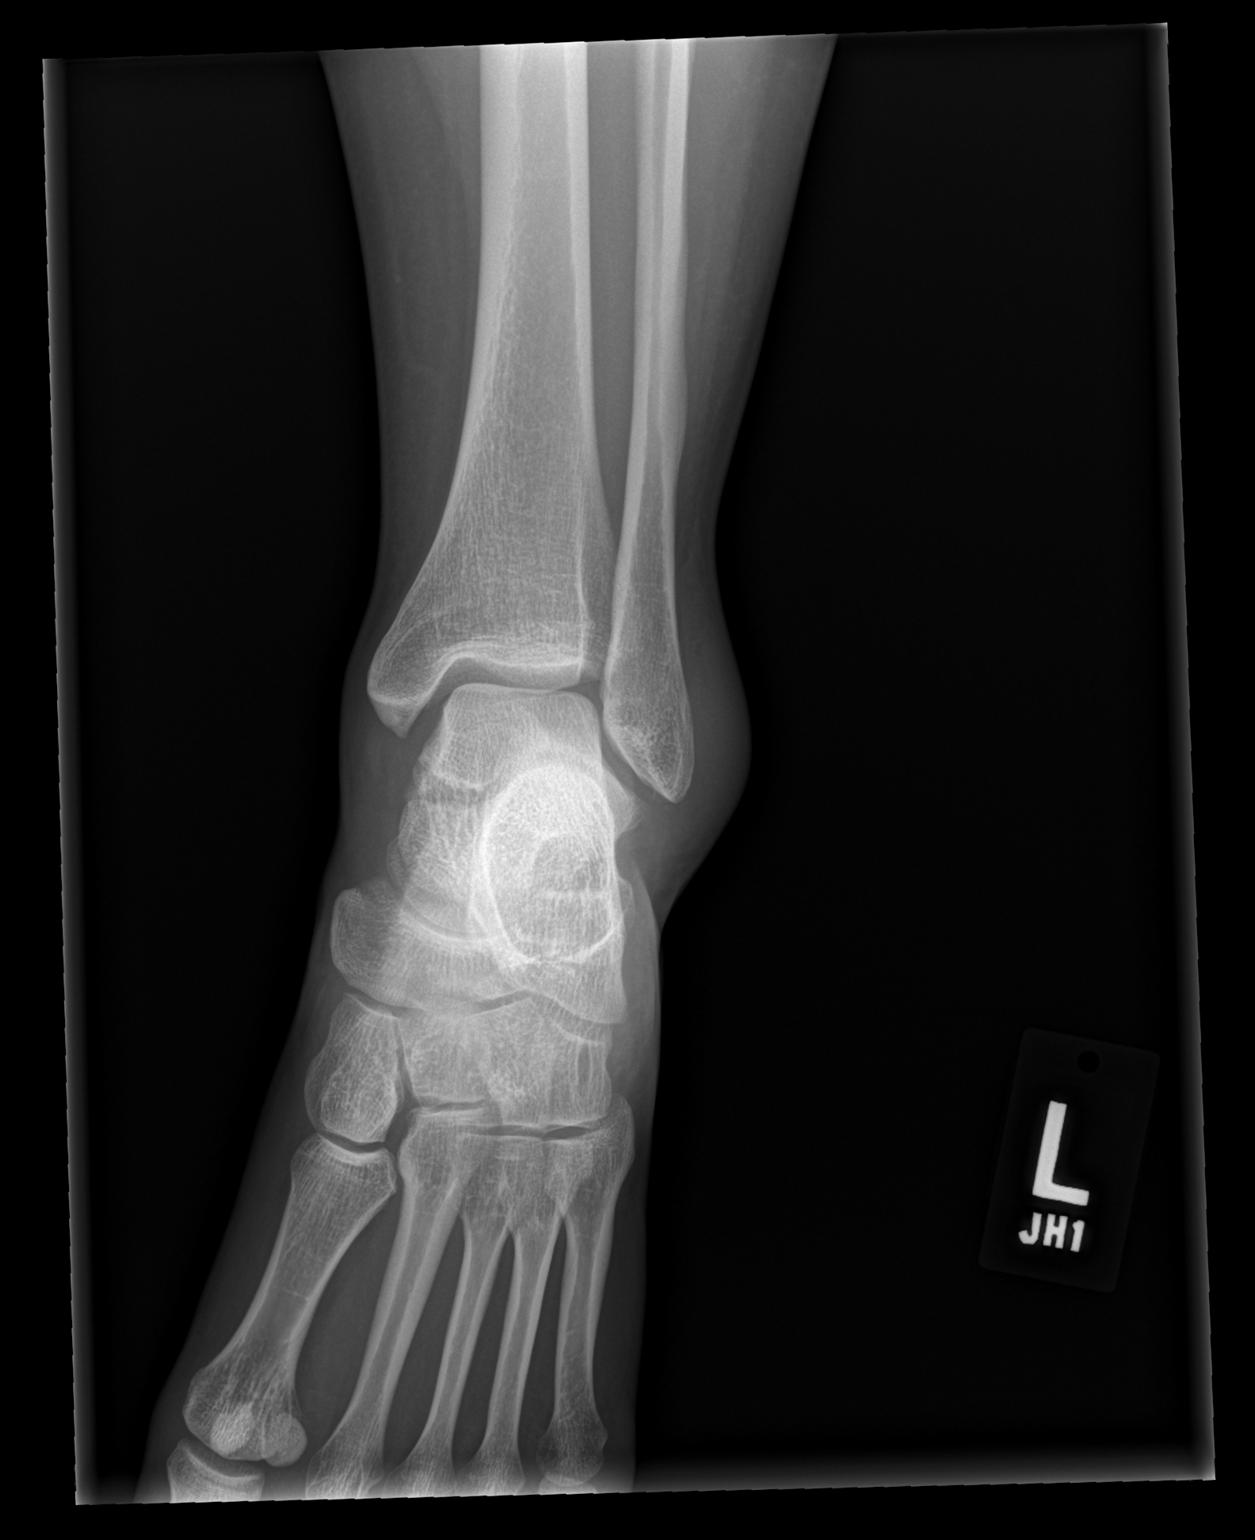

[x ankle obl left]
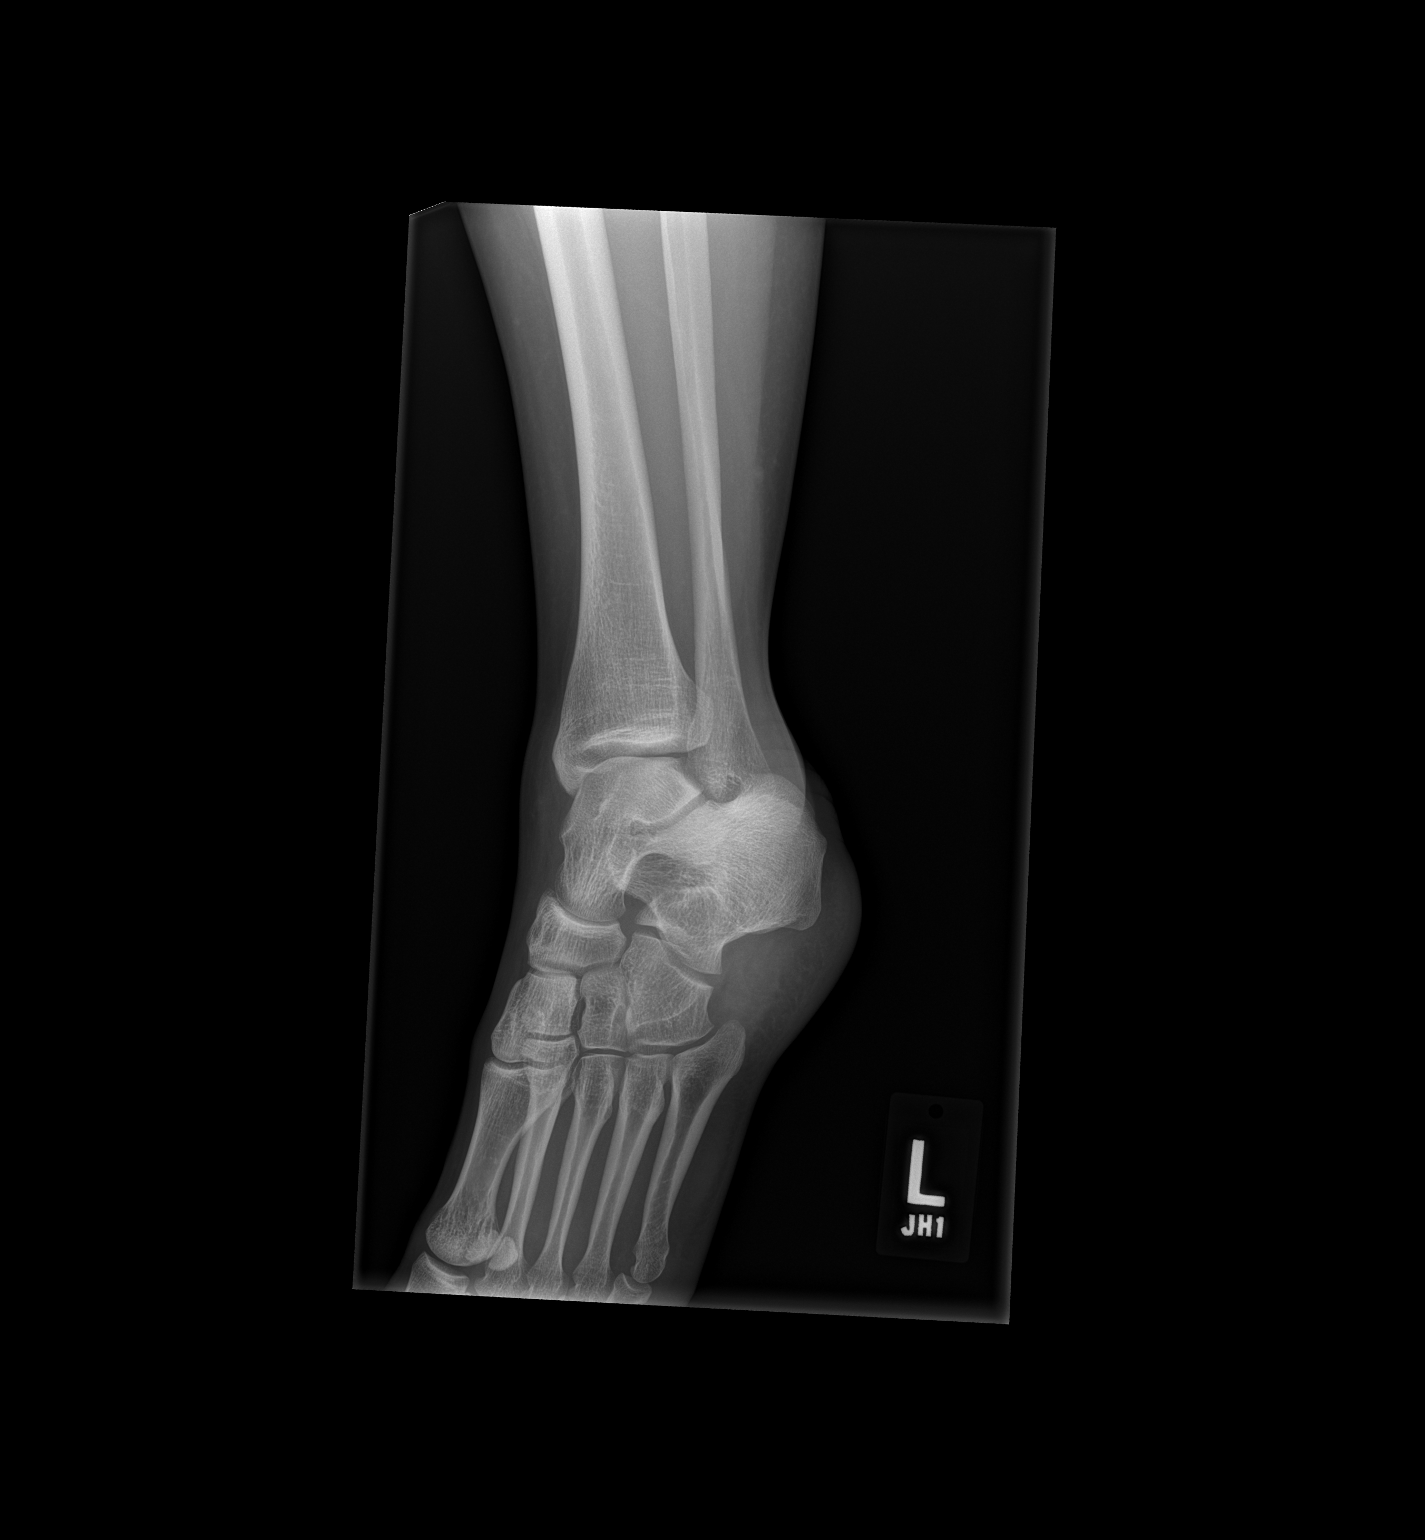

[x ankle lat left]
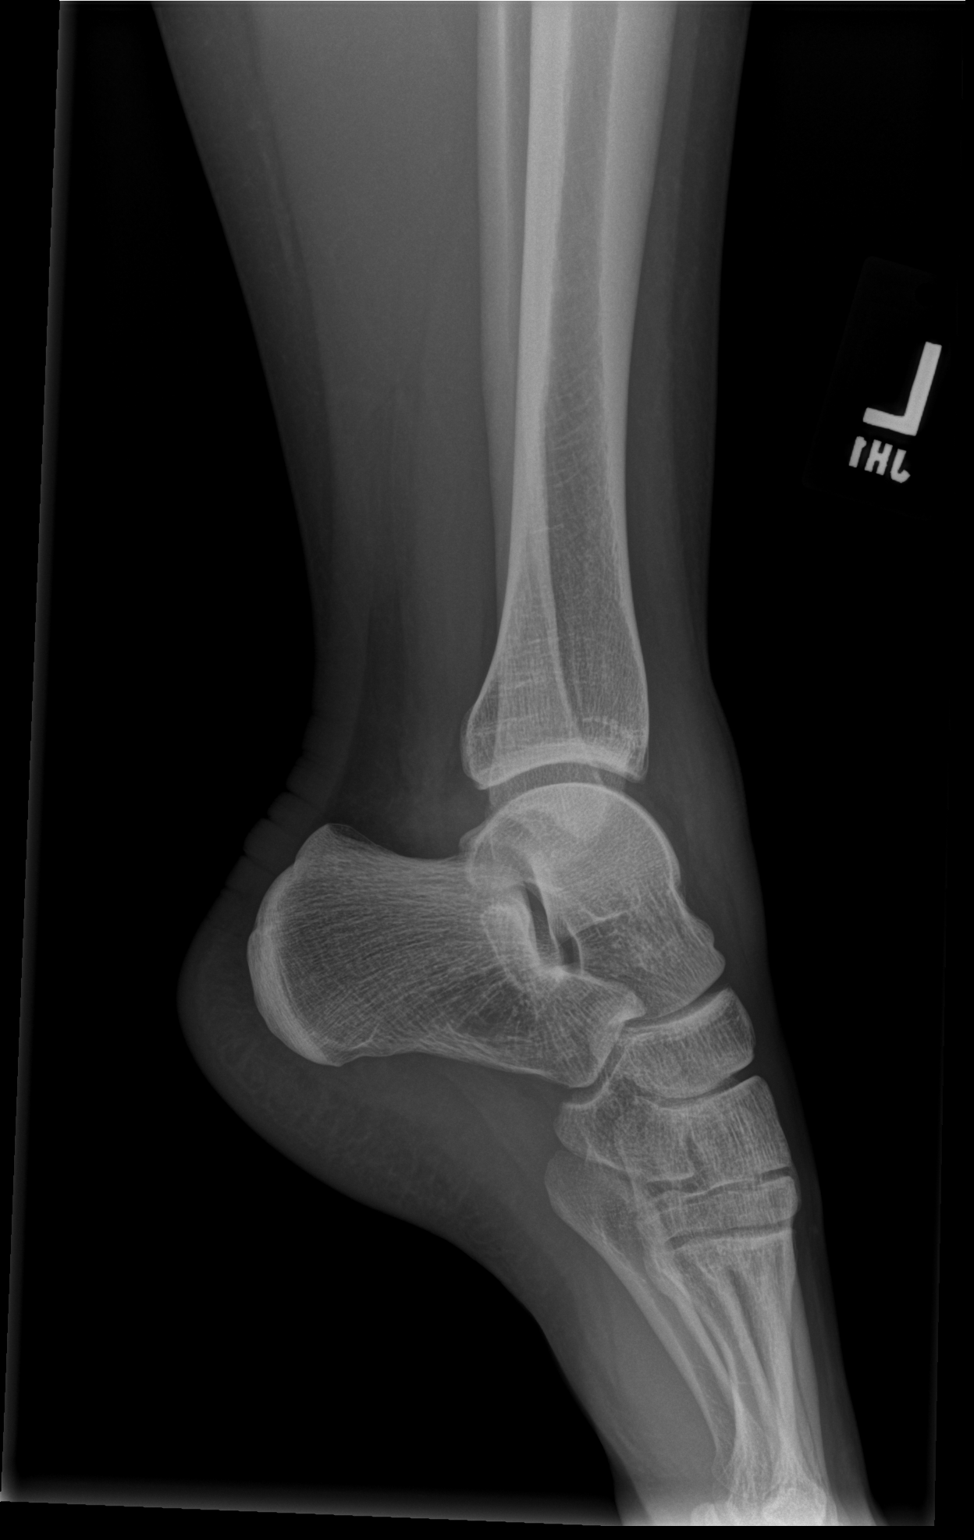

[3 of 3 positions shown; findings below may reference images not displayed]

FINDINGS: Lateral soft tissue swelling is identified. No bony or joint
abnormality is seen. No tibiotalar joint effusion.
IMPRESSION: Lateral soft tissue swelling without underlying bony or joint
abnormality.

## 2020-05-05 ENCOUNTER — Ambulatory Visit: Payer: PRIVATE HEALTH INSURANCE | Attending: Orthopedic Surgery | Admitting: Orthopedic Surgery

## 2020-05-05 ENCOUNTER — Ambulatory Visit
Admission: RE | Admit: 2020-05-05 | Discharge: 2020-05-05 | Disposition: A | Discharge status: Home / Self Care | Payer: PRIVATE HEALTH INSURANCE | Source: Ambulatory Visit

## 2020-05-05 VITALS — BP 100/60 | HR 69 | Ht 63.0 in | Wt 105.0 lb

## 2020-05-05 DIAGNOSIS — S52022D Displaced fracture of olecranon process without intraarticular extension of left ulna, subsequent encounter for closed fracture with routine healing: Secondary | ICD-10-CM | POA: Insufficient documentation

## 2020-05-05 DIAGNOSIS — Z09 Encounter for follow-up examination after completed treatment for conditions other than malignant neoplasm: Secondary | ICD-10-CM

## 2020-05-05 DIAGNOSIS — Z8781 Personal history of (healed) traumatic fracture: Secondary | ICD-10-CM

## 2020-05-09 ENCOUNTER — Encounter: Payer: Self-pay | Admitting: Orthopedic Surgery

## 2020-05-09 NOTE — Progress Notes (Signed)
Subjective   Patient ID: Lydia Harrison is a 24 y.o. female.  Chief Complaint   Patient presents with    Follow-up     s/p ORIF of left olecranon fracture on 01/19/14     HPI Has had several episodes of spontaneous olecranon bursitis over past couple months. Evaluated locally and aspirated. No positive cultures. She denies trauma.  Otherwise not bothered by hardware.        Objective   Vitals:    05/05/20 1132   BP: 100/60   Pulse: 69   Weight: 47.6 kg (105 lb)   Height: 1.6 m (5\' 3" )   Ortho Exam    Left elbow with nicely healed incision. No erythema. NO evidence of recent drainage. Hardware not tender. Very mild swelling of bursa present.   Elbow ROM 0-150  Full supination and pronation  Intact light touch sensation over small finger    Imaging:  Xray was personally reviewed and demonstrates:  no change in the position of hardware or fracture alignment    Assessment     ICD-10-CM ICD-9-CM   1. 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 Closed fracture of olecranon process of ulna, left, with routine healing, subsequent encounter  - Elbow LEFT limited AP and Lateral; Future  I offered hardware removal given it is likely the source of irritation.  She would like to schedule in August when she is home. Plan reviewed. It will be ambulatory, CPT 20680, HW removal left elbow. 60 minutes in OR  Return if symptoms worsen or fail to improve.

## 7739-06-06 DEATH — deceased
